# Patient Record
Sex: Female | Born: 1995 | Hispanic: No | Marital: Single | Smoking: Never smoker
Health system: Southern US, Community
[De-identification: ages and names within clinical notes are randomized; demographics above are authoritative.]

## PROBLEM LIST (undated history)

## (undated) DIAGNOSIS — B360 Pityriasis versicolor: Secondary | ICD-10-CM

## (undated) HISTORY — DX: Pityriasis versicolor: B36.0

---
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
## 2018-10-29 ENCOUNTER — Emergency Department (INDEPENDENT_AMBULATORY_CARE_PROVIDER_SITE_OTHER): Payer: PRIVATE HEALTH INSURANCE

## 2018-10-29 ENCOUNTER — Emergency Department (INDEPENDENT_AMBULATORY_CARE_PROVIDER_SITE_OTHER)
Admission: EM | Admit: 2018-10-29 | Discharge: 2018-10-29 | Disposition: A | Discharge status: Home / Self Care | Payer: PRIVATE HEALTH INSURANCE | Source: Home / Self Care

## 2018-10-29 ENCOUNTER — Other Ambulatory Visit: Payer: Self-pay

## 2018-10-29 DIAGNOSIS — S99921A Unspecified injury of right foot, initial encounter: Secondary | ICD-10-CM | POA: Diagnosis not present

## 2018-10-29 DIAGNOSIS — S99911A Unspecified injury of right ankle, initial encounter: Secondary | ICD-10-CM

## 2018-10-29 DIAGNOSIS — S82301A Unspecified fracture of lower end of right tibia, initial encounter for closed fracture: Secondary | ICD-10-CM | POA: Diagnosis not present

## 2018-10-29 MED ORDER — TRAMADOL HCL 50 MG PO TABS: 50.0000 mg | ORAL_TABLET | Freq: Four times a day (QID) | ORAL | 0 refills | Status: AC | PRN

## 2018-10-29 MED ORDER — IBUPROFEN 600 MG PO TABS
600.0000 mg | ORAL_TABLET | Freq: Once | ORAL | Status: AC
  Administered 2018-10-29: 17:00:00 600 mg via ORAL

## 2018-10-29 NOTE — ED Triage Notes (Signed)
Pt was skating yesterday, and lost balance and her right foot "turned in"  Pt heard a pop.  Ice, elevated yesterday.  Has ACE wrap on.

## 2018-10-29 NOTE — Discharge Instructions (Signed)
You should wear the boot as often as possible, especially when up and about. Please use your crutches to make sure you do not put ANY weight onto your Right foot/leg. You may remove your boot to bath and to apply a cool compress 2-3 times a day while elevating your leg to help with pain and swelling.  If pain becomes worse and worse despite taking prescribed pain medication, your calf gets tight and severely tender, please go to the emergency department for further evaluation and treatment. This can be a sign of compartment syndrome, a medical emergency that requires immediate care.   Tramadol is strong pain medication. While taking, do not drink alcohol, drive, or perform any other activities that requires focus while taking these medications.   You may also take 500mg  acetaminophen every 4-6 hours or in combination with ibuprofen 400-600mg  every 6-8 hours as needed for pain and inflammation.  Please call Sports Medicine tomorrow to schedule a follow up appointment for further evaluation and treatment of your leg fracture.

## 2018-10-29 NOTE — ED Provider Notes (Signed)
[facility]    CSN: [id number] Arrival date & time: 10/29/18  1519     History   Chief Complaint Chief Complaint  Patient presents with  . Foot Pain    HPI LARSYN LOSA is a 23 y.o. female.   HPI Kayla Browning is a 23 y.o. female presenting to UC with c/o Right ankle pain and swelling radiating into her leg and foot. Pain started after she lost her balance while skating yesterday, rolling her foot inward.  She heard a pop and felt immediate severe pain.  Unable to bear weight. She applied ice, a compression wrap and took tylenol and motrin with mild relief.  No prior fracture or injury to same ankle/foot. No other injuries from the fall.    Past Medical History:  Diagnosis Date  . Tinea versicolor 01/27/2012    Patient Active Problem List   Diagnosis Date Noted  . Chronic headaches 06/26/2012  . Tinea versicolor 01/27/2012  . Hyperpigmentation of skin, postinflammatory 01/27/2012    History reviewed. No pertinent surgical history.  OB History   No obstetric history on file.      Home Medications    Prior to Admission medications   Medication Sig Start Date End Date Taking? Authorizing Provider  traMADol (ULTRAM) 50 MG tablet Take 1 tablet (50 mg total) by mouth every 6 (six) hours as needed. 10/29/18   Lurene Shadow, PA-C    Family History History reviewed. No pertinent family history.  Social History Social History   Tobacco Use  . Smoking status: Never Smoker  . Smokeless tobacco: Never Used  Substance Use Topics  . Alcohol use: No  . Drug use: No     Allergies   Patient has no known allergies.   Review of Systems Review of Systems  Musculoskeletal: Positive for arthralgias and joint swelling.  Skin: Negative for color change and wound.     Physical Exam Triage Vital Signs ED Triage Vitals [10/29/18 1541]  Enc Vitals Group     BP 105/73     Pulse Rate 73     Resp 20     Temp 98.3 F (36.8 C)     Temp Source Oral      SpO2 99 %     Weight 127 lb (57.6 kg)     Height 5\' 1"  (1.549 m)     Head Circumference      Peak Flow      Pain Score      Pain Loc      Pain Edu?      Excl. in GC?    No data found.  Updated Vital Signs BP 105/73 (BP Location: Right Arm)   Pulse 73   Temp 98.3 F (36.8 C) (Oral)   Resp 20   Ht 5\' 1"  (1.549 m)   Wt 127 lb (57.6 kg)   LMP 10/15/2018   SpO2 99%   BMI 24.00 kg/m   Visual Acuity Right Eye Distance:   Left Eye Distance:   Bilateral Distance:    Right Eye Near:   Left Eye Near:    Bilateral Near:     Physical Exam Vitals signs and nursing note reviewed.  Constitutional:      Appearance: Normal appearance. She is well-developed.  HENT:     Head: Normocephalic and atraumatic.  Neck:     Musculoskeletal: Normal range of motion.  Cardiovascular:     Rate and Rhythm: Normal rate.  Pulses:          Dorsalis pedis pulses are 2+ on the right side.       Posterior tibial pulses are 2+ on the right side.  Pulmonary:     Effort: Pulmonary effort is normal.  Musculoskeletal:        General: Swelling and tenderness present.     Comments: Right ankle: mild to moderate edema to lateral malleolus. Mild diffuse tenderness of ankle. Right foot: mild edema, tenderness to dorsal aspect.  Limited plantarflexion and dorsiflexion due to pain. Calf is soft, non-tender. No knee tenderness.   Skin:    General: Skin is warm and dry.     Capillary Refill: Capillary refill takes less than 2 seconds.     Findings: No bruising.     Comments: Right ankle and foot: skin in tact. No ecchymosis or erythema.   Neurological:     Mental Status: She is alert and oriented to person, place, and time.  Psychiatric:        Behavior: Behavior normal.      UC Treatments / Results  Labs (all labs ordered are listed, but only abnormal results are displayed) Labs Reviewed - No data to display  EKG None  Radiology Dg Ankle Complete Right  Result Date: 10/29/2018  CLINICAL DATA:  Pain after trauma EXAM: RIGHT ANKLE - COMPLETE 3+ VIEW COMPARISON:  None. FINDINGS: There is a fracture through the distal tibial diaphysis which is comminuted but nondisplaced. No other fractures identified. The ankle mortise is intact. IMPRESSION: There is a comminuted nondisplaced fracture through the distal tibial diaphysis. Electronically Signed   By: Gerome Samavid  Williams III M.D   On: 10/29/2018 15:57   Dg Foot Complete Right  Result Date: 10/29/2018 CLINICAL DATA:  Pain after trauma EXAM: RIGHT FOOT COMPLETE - 3+ VIEW COMPARISON:  None. FINDINGS: Comminuted nondisplaced fracture through the distal tibial diaphysis. No fractures identified in the foot. IMPRESSION: No foot fracture noted. Comminuted nondisplaced fracture of the distal tibial diaphysis. Electronically Signed   By: Gerome Samavid  Williams III M.D   On: 10/29/2018 16:00    Procedures Splint Application Date/Time: 10/29/2018 4:51 PM Performed by: Lurene ShadowPhelps, Jerel Sardina O, PA-C Authorized by: Lurene ShadowPhelps, Brode Sculley O, PA-C   Consent:    Consent obtained:  Verbal   Consent given by:  Patient   Risks discussed:  Discoloration, numbness, pain and swelling   Alternatives discussed:  Delayed treatment and no treatment Pre-procedure details:    Sensation:  Normal   Skin color:  Pink Procedure details:    Laterality:  Right   Location:  Leg   Leg:  R lower leg   Strapping: no     Splint type:  Long leg (bulky Jone's)   Supplies:  Ortho-Glass, elastic bandage and cotton padding Post-procedure details:    Pain:  Unchanged   Sensation:  Normal   Skin color:  Pink   Patient tolerance of procedure:  Tolerated well, no immediate complications   (including critical care time)  Medications Ordered in UC Medications  ibuprofen (ADVIL) tablet 600 mg (600 mg Oral Given 10/29/18 1630)    Initial Impression / Assessment and Plan / UC Course  I have reviewed the triage vital signs and the nursing notes.  Pertinent labs & imaging results that  were available during my care of the patient were reviewed by me and considered in my medical decision making (see chart for details).     Reviewed imaging with pt No evidence of compartment syndrome  noted on exam. Discussed home care including rest, ice, and elevation. Attempted to place pt in tall walking boot, pt unable to tolerate her foot at full 90* dorsiflexion. Consulted with Dr. Benjamin Stain, Sports Medicine, who recommended applying a bulky Jone's splint. Splint applied as noted above.   Advised pt not to bear any weight onto her Right leg.  Encouraged to call Sports Medicine tomorrow to schedule f/u with Sports Medicine on Tuesday, 10/30/2018.  AVS provided.  Final Clinical Impressions(s) / UC Diagnoses   Final diagnoses:  Right foot injury, initial encounter  Right ankle injury, initial encounter  Traumatic closed nondisplaced fracture of distal tibia, right, initial encounter     Discharge Instructions     You should wear the boot as often as possible, especially when up and about. Please use your crutches to make sure you do not put ANY weight onto your Right foot/leg. You may remove your boot to bath and to apply a cool compress 2-3 times a day while elevating your leg to help with pain and swelling.  If pain becomes worse and worse despite taking prescribed pain medication, your calf gets tight and severely tender, please go to the emergency department for further evaluation and treatment. This can be a sign of compartment syndrome, a medical emergency that requires immediate care.   Tramadol is strong pain medication. While taking, do not drink alcohol, drive, or perform any other activities that requires focus while taking these medications.   You may also take  acetaminophen every 4-6 hours or in combination with ibuprofen 400-600mg  every 6-8 hours as needed for pain and inflammation.  Please call Sports Medicine tomorrow to schedule a follow up appointment  for further evaluation and treatment of your leg fracture.     ED Prescriptions    Medication Sig Dispense Auth. Provider   traMADol (ULTRAM) 50 MG tablet Take 1 tablet (50 mg total) by mouth every 6 (six) hours as needed. 15 tablet Lurene Shadow, PA-C     Controlled Substance Prescriptions [place] Controlled Substance Registry consulted? Yes, I have consulted the [place] Controlled Substances Registry for this patient, and feel the risk/benefit ratio today is favorable for proceeding with this prescription for a controlled substance.   Lurene Shadow, [place] 10/29/18 1653

## 2018-10-30 ENCOUNTER — Telehealth: Payer: Self-pay | Admitting: *Deleted

## 2018-10-30 NOTE — Telephone Encounter (Signed)
Pt's mother called LM requesting that her work note be extended to be out of work for [employer]. I called the patient back and notified her that sports medicine will be the one to make decisions about her return to work. Pt verbalized understanding and will call them today to schedule her f/u appt.

## 2018-10-31 ENCOUNTER — Telehealth: Payer: Self-pay

## 2018-10-31 NOTE — Telephone Encounter (Signed)
Left message on VM with contact information for any questions or concerns. 

## 2020-02-26 ENCOUNTER — Other Ambulatory Visit: Payer: Self-pay

## 2020-02-26 ENCOUNTER — Emergency Department
Admission: EM | Admit: 2020-02-26 | Discharge: 2020-02-26 | Discharge status: Against Medical Advice | Payer: BLUE CROSS/BLUE SHIELD | Source: Home / Self Care

## 2020-08-03 ENCOUNTER — Other Ambulatory Visit: Payer: Self-pay

## 2020-08-03 ENCOUNTER — Emergency Department (INDEPENDENT_AMBULATORY_CARE_PROVIDER_SITE_OTHER)
Admission: EM | Admit: 2020-08-03 | Discharge: 2020-08-03 | Disposition: A | Discharge status: Home / Self Care | Payer: Self-pay | Source: Home / Self Care

## 2020-08-03 ENCOUNTER — Encounter: Payer: Self-pay | Admitting: Emergency Medicine

## 2020-08-03 DIAGNOSIS — N926 Irregular menstruation, unspecified: Secondary | ICD-10-CM

## 2020-08-03 LAB — POCT URINALYSIS DIP (MANUAL ENTRY)
Bilirubin, UA: NEGATIVE
Glucose, UA: NEGATIVE mg/dL
Ketones, POC UA: NEGATIVE mg/dL
Leukocytes, UA: NEGATIVE
Nitrite, UA: NEGATIVE
Protein Ur, POC: NEGATIVE mg/dL
Spec Grav, UA: >=1.03 — AB (ref 1.010–1.025)
Urobilinogen, UA: 0.2 E.U./dL
pH, UA: 6 (ref 5.0–8.0)

## 2020-08-03 LAB — POCT URINE PREGNANCY: Preg Test, Ur: NEGATIVE

## 2020-08-03 NOTE — ED Triage Notes (Signed)
Patient c/o vaginal spotting x 1 day, unsure of when her period is due, patient is irregular.  Patient took a home pregnancy test which was negative.  Patient is not on any birth control.  No urinary sxs.

## 2020-08-03 NOTE — Discharge Instructions (Signed)
Your urinalysis was normal and your urine pregnancy is negative.  Your bleeding today is likely related to the onset of a new menstrual cycle.  Continue to hydrate well with fluids.  Monitor your cycle if you continue to have menstrual irregularity follow-up with an OB/GYN.

## 2020-12-09 IMAGING — DX RIGHT FOOT COMPLETE - 3+ VIEW
3 series · 3 of 3 positions shown · non-contrast
Comparison: None.

CLINICAL DATA: Pain after trauma

EXAM:
RIGHT FOOT COMPLETE - 3+ VIEW

[foot ap]
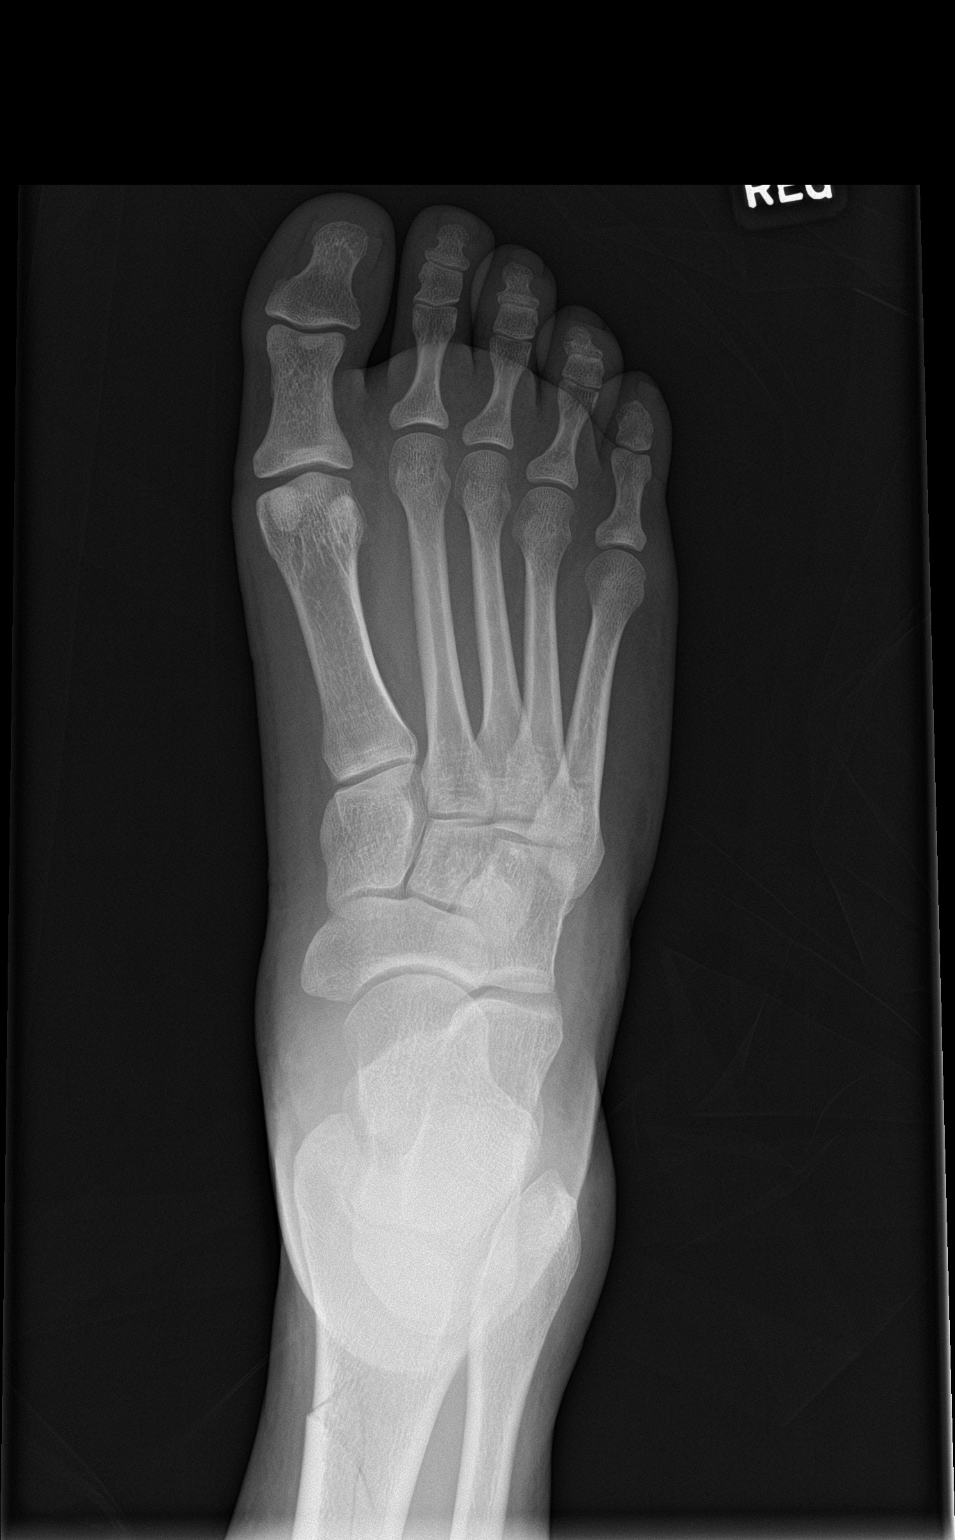

[foot obl]
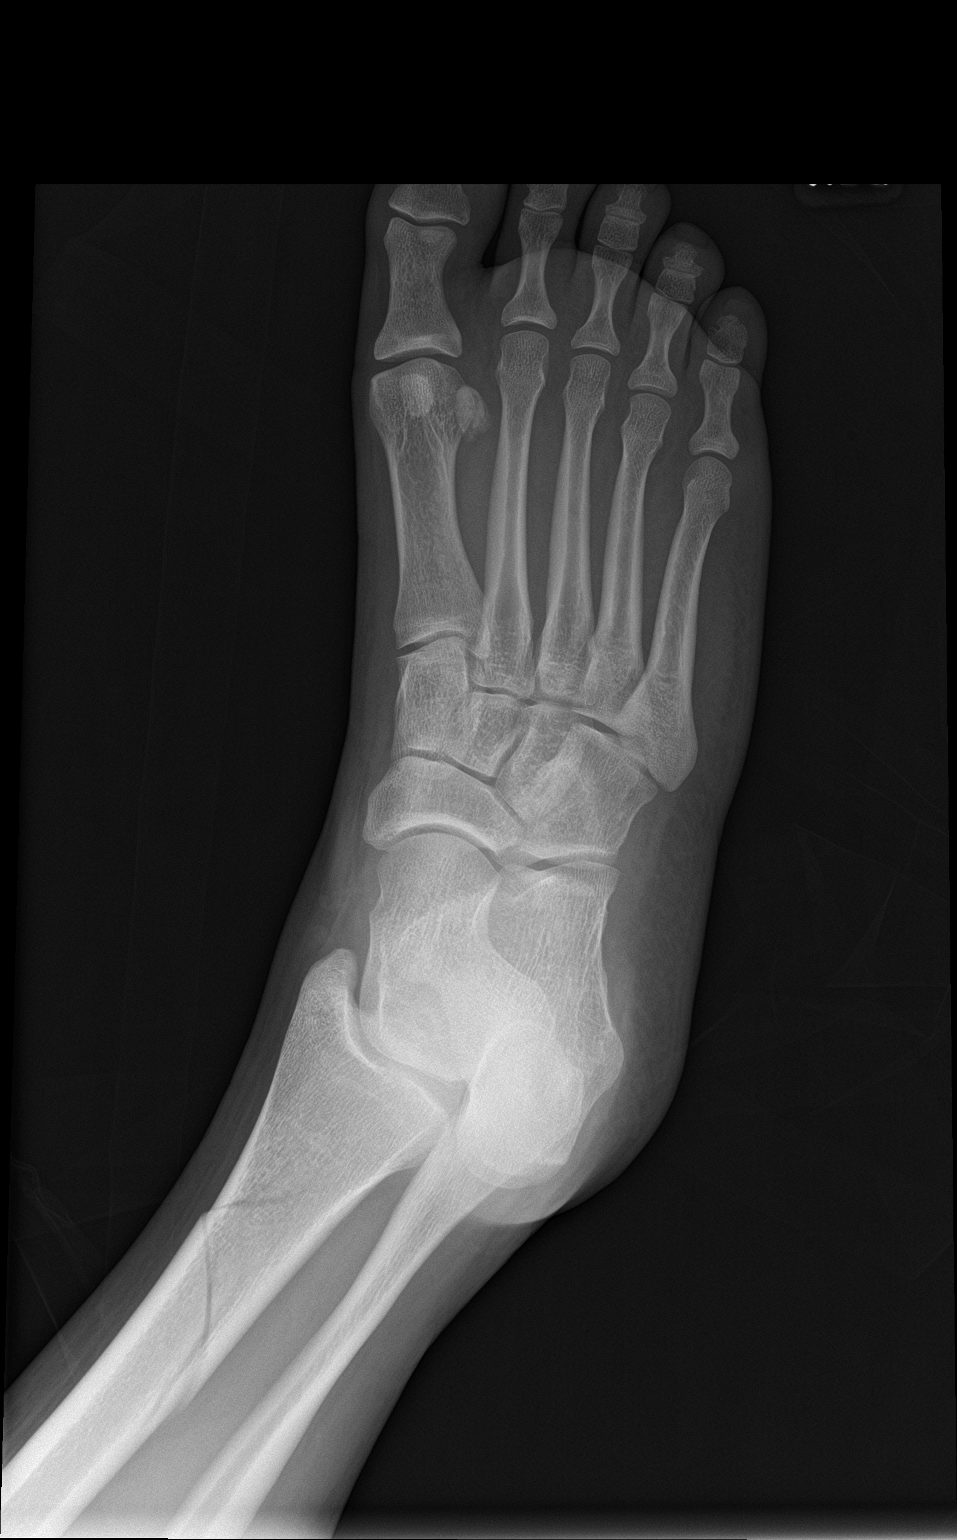

[foot lat]
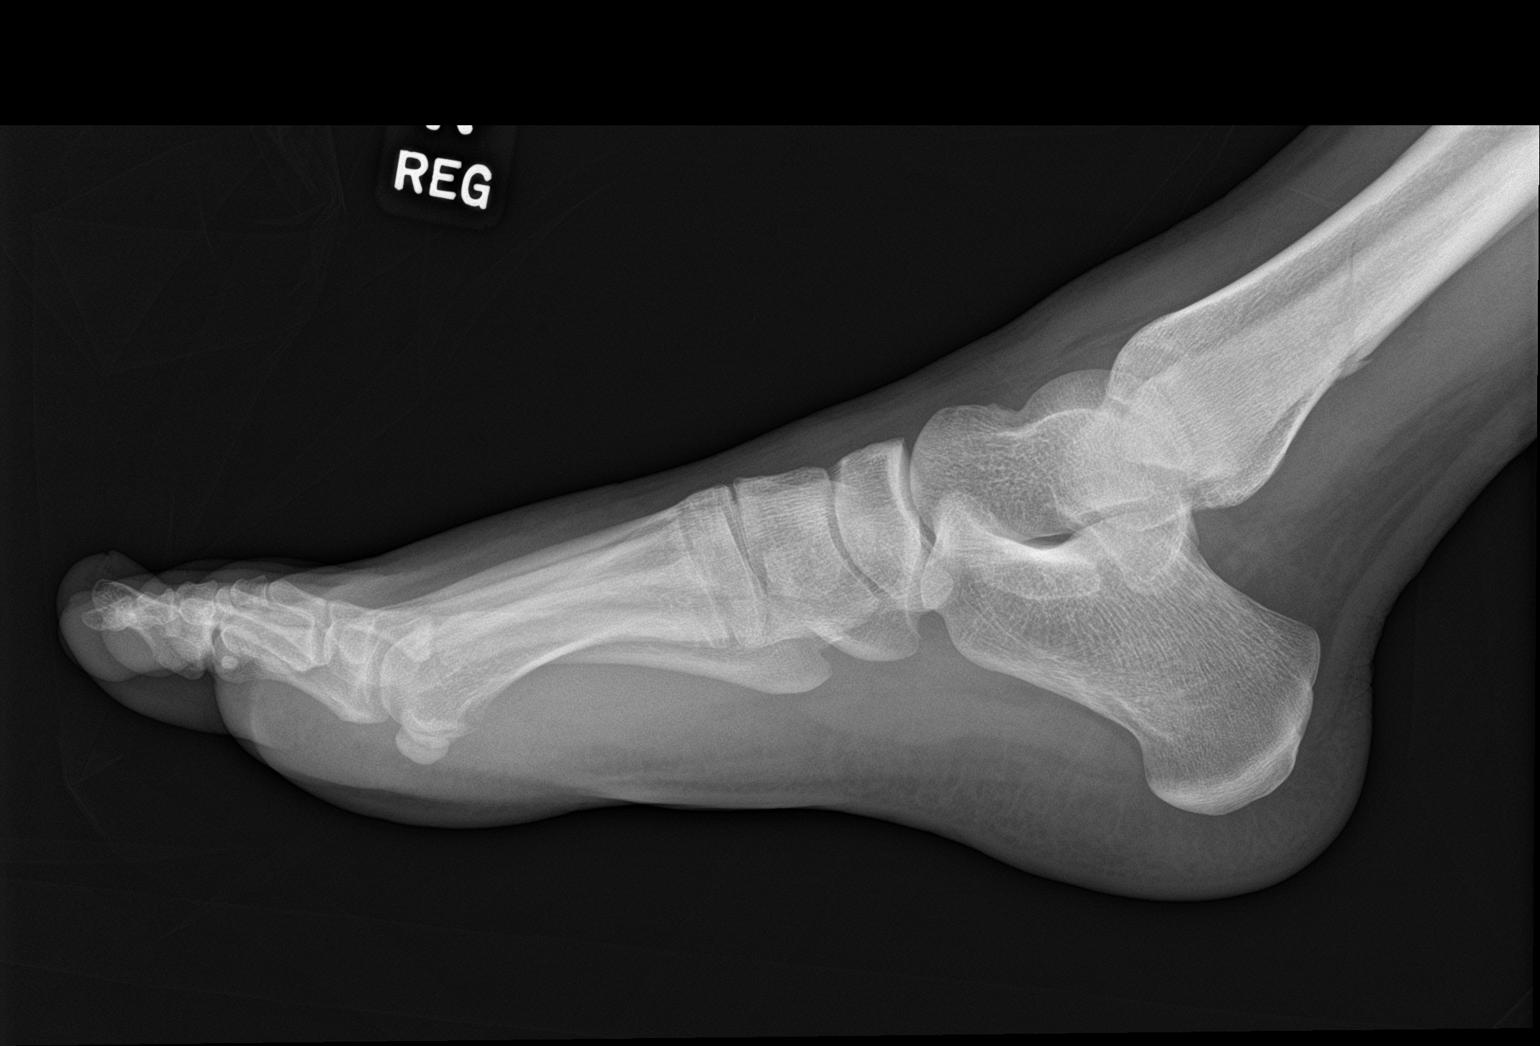

[3 of 3 positions shown; findings below may reference images not displayed]

FINDINGS: Comminuted nondisplaced fracture through the distal tibial
diaphysis. No fractures identified in the foot.
IMPRESSION: No foot fracture noted. Comminuted nondisplaced fracture of the
distal tibial diaphysis.

## 2021-05-20 ENCOUNTER — Encounter: Payer: Self-pay | Admitting: *Deleted

## 2021-06-02 ENCOUNTER — Telehealth: Payer: Self-pay | Admitting: *Deleted

## 2021-06-02 NOTE — Telephone Encounter (Signed)
Left a message to call the office to schedule annual well woman visit with a pap per referral from PCP, [facility].

## 2021-06-23 ENCOUNTER — Telehealth: Payer: Self-pay | Admitting: *Deleted

## 2021-06-23 NOTE — Telephone Encounter (Signed)
Left patient a message to call and schedule annual and pap per [facility] referral.
